# Patient Record
Sex: Male | Born: 1985 | Race: White | Hispanic: No | Marital: Married | Smoking: Never smoker
Health system: Southern US, Community
[De-identification: ages and names within clinical notes are randomized; demographics above are authoritative.]

---

## 2008-03-31 ENCOUNTER — Ambulatory Visit (HOSPITAL_COMMUNITY): Admission: RE | Admit: 2008-03-31 | Discharge: 2008-03-31 | Payer: Self-pay | Admitting: Orthopedic Surgery

## 2008-09-20 DIAGNOSIS — D229 Melanocytic nevi, unspecified: Secondary | ICD-10-CM

## 2008-09-20 HISTORY — DX: Melanocytic nevi, unspecified: D22.9

## 2010-10-12 ENCOUNTER — Encounter: Payer: Self-pay | Admitting: Orthopedic Surgery

## 2019-12-17 ENCOUNTER — Ambulatory Visit: Payer: Self-pay

## 2019-12-17 DIAGNOSIS — Z23 Encounter for immunization: Secondary | ICD-10-CM | POA: Diagnosis not present

## 2020-01-18 DIAGNOSIS — Z23 Encounter for immunization: Secondary | ICD-10-CM | POA: Diagnosis not present

## 2020-03-26 DIAGNOSIS — Z20822 Contact with and (suspected) exposure to covid-19: Secondary | ICD-10-CM | POA: Diagnosis not present

## 2020-11-14 ENCOUNTER — Emergency Department (HOSPITAL_COMMUNITY)
Admission: EM | Admit: 2020-11-14 | Discharge: 2020-11-14 | Disposition: A | Discharge status: Home / Self Care | Payer: BC Managed Care – PPO | Attending: Emergency Medicine | Admitting: Emergency Medicine

## 2020-11-14 ENCOUNTER — Emergency Department (HOSPITAL_COMMUNITY): Payer: BC Managed Care – PPO

## 2020-11-14 ENCOUNTER — Other Ambulatory Visit: Payer: Self-pay

## 2020-11-14 DIAGNOSIS — N50812 Left testicular pain: Secondary | ICD-10-CM | POA: Diagnosis not present

## 2020-11-14 DIAGNOSIS — N433 Hydrocele, unspecified: Secondary | ICD-10-CM | POA: Diagnosis not present

## 2020-11-14 DIAGNOSIS — I861 Scrotal varices: Secondary | ICD-10-CM | POA: Diagnosis not present

## 2020-11-14 LAB — URINALYSIS, ROUTINE W REFLEX MICROSCOPIC
Bilirubin Urine: NEGATIVE
Glucose, UA: NEGATIVE mg/dL
Hgb urine dipstick: NEGATIVE
Ketones, ur: NEGATIVE mg/dL
Leukocytes,Ua: NEGATIVE
Nitrite: NEGATIVE
Protein, ur: NEGATIVE mg/dL
Specific Gravity, Urine: 1.008 (ref 1.005–1.030)
pH: 7 (ref 5.0–8.0)

## 2020-11-14 NOTE — ED Triage Notes (Signed)
Pt here POV with reports of pain to L testicle since Tuesday. Reports pain has not gone all the way away and is a constant dull pain and then gets sharp pains.

## 2020-11-14 NOTE — Discharge Instructions (Addendum)
Your ultrasound and urinalysis today were overall reassuring and do not show an obvious cause for your testicular pain.  Could be mild orchitis due to recent viral infection, you can treat with Tylenol, ibuprofen, and make sure you are wearing supportive underwear, avoid running or strenuous activity. I have sent gonorrhea and Chlamydia testing although I have low suspicion for this if any of these tests do come back positive you will be contacted by phone and will need to follow-up for treatment.  If symptoms persist please follow-up with urology for further evaluation.

## 2020-11-14 NOTE — ED Provider Notes (Signed)
[facility] EMERGENCY DEPARTMENT Provider Note   CSN: [id number] Arrival date & time: 11/14/20  0845     History Chief Complaint  Patient presents with  . Testicle Pain    Marco Parker is a 35 y.o. male.  Marco Parker is a 35 y.o. male who is otherwise healthy, presents to the emergency department for evaluation of left testicular pain.  He reports symptoms started on Tuesday night when he started having severe sharp stabbing pains in the left testicle.  He reports when the started he felt a little bit lightheaded and nauseous but then pain seemed to ease off.  He reports he has continued to have a dull ache that will not resolve and will intermittently have some similar sharp pains.  He has not noted any swelling or masses.  No discoloration of the scrotum.  Denies any burning or pain with urination or penile discharge.  Reports he is sexually active only with his wife and has low suspicion for STI.  No hematuria.  No associated abdominal pain.  Denies any history of prior testicular pain.  Does report that he and all of his family members just got over Covid in the past week or 2.        No past medical history on file.  There are no problems to display for this patient.        No family history on file.     Home Medications Prior to Admission medications   Not on File    Allergies    Patient has no known allergies.  Review of Systems   Review of Systems  Constitutional: Negative for chills and fever.  HENT: Negative.   Respiratory: Negative for cough and shortness of breath.   Cardiovascular: Negative for chest pain.  Gastrointestinal: Negative for abdominal pain, nausea and vomiting.  Genitourinary: Positive for testicular pain. Negative for decreased urine volume, dysuria, flank pain, frequency, genital sores, penile pain, penile swelling and scrotal swelling.  Musculoskeletal: Negative for arthralgias and myalgias.  Skin: Negative for  color change and rash.  Neurological: Negative for dizziness, syncope and light-headedness.  All other systems reviewed and are negative.   Physical Exam Updated Vital Signs BP (!) 166/86   Pulse 85   Temp 98.1 F (36.7 C) (Oral)   Resp 16   Ht 5\' 11"  (1.803 m)   Wt 68 kg   SpO2 100%   BMI 20.92 kg/m   Physical Exam Vitals and nursing note reviewed.  Constitutional:      General: He is not in acute distress.    Appearance: Normal appearance. He is well-developed, normal weight and well-nourished. He is not ill-appearing or diaphoretic.     Comments: Well-appearing and in no distress   HENT:     Head: Normocephalic and atraumatic.     Mouth/Throat:     Mouth: Oropharynx is clear and moist. Mucous membranes are moist.     Pharynx: Oropharynx is clear.  Eyes:     General:        Right eye: No discharge.        Left eye: No discharge.     Extraocular Movements: EOM normal.  Cardiovascular:     Rate and Rhythm: Normal rate and regular rhythm.     Pulses: Intact distal pulses.     Heart sounds: Normal heart sounds. No murmur heard. No friction rub. No gallop.   Pulmonary:     Effort: Pulmonary effort is normal.  No respiratory distress.     Breath sounds: Normal breath sounds. No wheezing or rales.  Abdominal:     General: Bowel sounds are normal. There is no distension.     Palpations: Abdomen is soft. There is no mass.     Tenderness: There is no abdominal tenderness. There is no guarding.     Comments: Abdomen soft, nondistended, nontender to palpation in all quadrants without guarding or peritoneal signs   Genitourinary:    Comments: There is some mild tenderness present over the left testicle, slight swelling noted but no obvious masses, no tenderness over the right testicle, it is higher riding than the left.  Mild erythema generally over the scrotum.  Penis normal without discharge.  No inguinal lymphadenopathy or tenderness over the inguinal canal, no inguinal hernias  noted. Musculoskeletal:        General: No deformity or edema.     Cervical back: Neck supple.  Skin:    General: Skin is warm and dry.     Capillary Refill: Capillary refill takes less than 2 seconds.  Neurological:     Mental Status: He is alert.     Coordination: Coordination normal.     Comments: Speech is clear, able to follow commands Moves extremities without ataxia, coordination intact  Psychiatric:        Mood and Affect: Mood normal.        Behavior: Behavior normal.     ED Results / Procedures / Treatments   Labs (all labs ordered are listed, but only abnormal results are displayed) Labs Reviewed  URINALYSIS, ROUTINE W REFLEX MICROSCOPIC - Abnormal; Notable for the following components:      Result Value   Color, Urine STRAW (*)    All other components within normal limits  GC/CHLAMYDIA PROBE AMP ([place]) NOT AT [facility]    EKG None  Radiology US SCROTUM W/DOPPLER  Result Date: 11/14/2020 CLINICAL DATA:  Left testicular pain since Tuesday EXAM: SCROTAL ULTRASOUND DOPPLER ULTRASOUND OF THE TESTICLES TECHNIQUE: Complete ultrasound examination of the testicles, epididymis, and other scrotal structures was performed. Color and spectral Doppler ultrasound were also utilized to evaluate blood flow to the testicles. COMPARISON:  None. FINDINGS: Right testicle Measurements: 5.0 x 2.7 x 3.4 cm. No mass or microlithiasis visualized. Left testicle Measurements: 4.2 x 3.6 x 3.4 cm. No mass or microlithiasis visualized. Right epididymis:  Normal in size and appearance. Left epididymis:  Normal in size and appearance. Hydrocele:  Small right hydrocele. Varicocele:  Borderline right-sided varicocele. Pulsed Doppler interrogation of both testes demonstrates normal low resistance arterial and venous waveforms bilaterally. Symmetric color Doppler signal bilaterally. IMPRESSION: 1. No acute process or explanation for left-sided testicular pain. No evidence of testicular torsion,  epididymitis. 2. Small right hydrocele. 3. Borderline right-sided varicocele. Electronically Signed   By: Abigail Miyamoto M.D.   On: 11/14/2020 10:20    Procedures Procedures   Medications Ordered in ED Medications - No data to display  ED Course  I have reviewed the triage vital signs and the nursing notes.  Pertinent labs & imaging results that were available during my care of the patient were reviewed by me and considered in my medical decision making (see chart for details).    MDM Rules/Calculators/A&P                         34 year old male presents with 3 days of testicular pain which was initially severe and is now more  mild, dull ache.  No associated urinary symptoms, slight swelling but no palpable masses.  No associated discharge and no concern for STIs per patient.  No associated abdominal pain and no hernias noted on exam.  Will get testicular ultrasound with Doppler and urinalysis, will also send GC/chlamydia, although have low suspicion for this.  Scrotal ultrasound with no acute process or explanation for left-sided testicular pain, no evidence of torsion or epididymitis.  There is a small right hydrocele and borderline right-sided varicocele.  Patient did recently have Covid infection, question whether he may have mild orchitis.  Urinalysis with no hematuria or signs of infection which is reassuring.  Low suspicion for epididymitis or STI.  Given reassuring work-up will have patient treat with NSAIDs, Tylenol and ensure he wears supportive underwear, will have him avoid strenuous activity or running that could worsen pain.  We will have him follow-up with urology if symptoms or not improving.  He expresses understanding and agreement with this plan.  Discharged home in good condition.  Final Clinical Impression(s) / ED Diagnoses Final diagnoses:  Left testicular pain    Rx / DC Orders ED Discharge Orders    None       Janet Berlin 11/14/20 1220     Sherwood Gambler, MD 11/15/20 [phone]

## 2020-11-14 NOTE — ED Notes (Signed)
Patient transported to Ultrasound 

## 2020-11-14 NOTE — ED Notes (Signed)
Pt returns from ultrasound

## 2020-11-17 LAB — GC/CHLAMYDIA PROBE AMP (~~LOC~~) NOT AT ARMC
Chlamydia: NEGATIVE
Comment: NEGATIVE
Comment: NORMAL
Neisseria Gonorrhea: NEGATIVE

## 2021-06-03 ENCOUNTER — Ambulatory Visit: Payer: Self-pay

## 2021-06-03 DIAGNOSIS — R509 Fever, unspecified: Secondary | ICD-10-CM | POA: Diagnosis not present

## 2021-06-03 DIAGNOSIS — R197 Diarrhea, unspecified: Secondary | ICD-10-CM | POA: Diagnosis not present

## 2021-08-03 ENCOUNTER — Emergency Department: Admit: 2021-08-03 | Discharge status: Absent without leave | Payer: Self-pay

## 2021-08-03 ENCOUNTER — Other Ambulatory Visit: Payer: Self-pay

## 2021-08-03 ENCOUNTER — Emergency Department (INDEPENDENT_AMBULATORY_CARE_PROVIDER_SITE_OTHER)
Admission: EM | Admit: 2021-08-03 | Discharge: 2021-08-03 | Disposition: A | Discharge status: Home / Self Care | Payer: BC Managed Care – PPO | Source: Home / Self Care

## 2021-08-03 DIAGNOSIS — R21 Rash and other nonspecific skin eruption: Secondary | ICD-10-CM | POA: Diagnosis not present

## 2021-08-03 DIAGNOSIS — B029 Zoster without complications: Secondary | ICD-10-CM

## 2021-08-03 MED ORDER — METHYLPREDNISOLONE 4 MG PO TBPK: ORAL_TABLET | ORAL | 0 refills | Status: AC

## 2021-08-03 NOTE — Discharge Instructions (Addendum)
Advised patient rash was initially shingles and now has crusted over and he may have postherpetic nerve pain for several weeks and may use OTC Tylenol or OTC Ibuprofen for pain.  Advised patient to take medication as directed with food to completion.  Encourage patient increase daily water intake while taking this medication.

## 2021-08-03 NOTE — ED Provider Notes (Signed)
[facility]    CSN: [id number] Arrival date & time: 08/03/21  1051      History   Chief Complaint Chief Complaint  Patient presents with   Rash    HPI Marco Parker is a 35 y.o. male.   HPI 35 year old male presents with a rash for 10 days.  Patient reports rash is over her left butt cheek and some tenderness in left groin area.  Reports rash is pruritic.  Reports using cortisone cream and Lotrimin as needed with little to no relief.  History reviewed. No pertinent past medical history.  There are no problems to display for this patient.   History reviewed. No pertinent surgical history.     Home Medications    Prior to Admission medications   Medication Sig Start Date End Date Taking? Authorizing Provider  methylPREDNISolone (MEDROL DOSEPAK) 4 MG TBPK tablet Take as directed. 08/03/21  Yes Eliezer Lofts, [place]    Family History History reviewed. No pertinent family history.  Social History Social History   Tobacco Use   Smoking status: Never   Smokeless tobacco: Never  Vaping Use   Vaping Use: Never used  Substance Use Topics   Alcohol use: Not Currently     Allergies   Patient has no known allergies.   Review of Systems Review of Systems  Skin:  Positive for rash.    Physical Exam Triage Vital Signs ED Triage Vitals  Enc Vitals Group     BP 08/03/21 1112 (!) 141/93     Pulse Rate 08/03/21 1112 75     Resp 08/03/21 1112 18     Temp 08/03/21 1112 98.6 F (37 C)     Temp Source 08/03/21 1112 Oral     SpO2 08/03/21 1112 99 %     Weight --      Height --      Head Circumference --      Peak Flow --      Pain Score 08/03/21 1114 0     Pain Loc --      Pain Edu? --      Excl. in [place]? --    No data found.  Updated Vital Signs BP (!) 141/93 (BP Location: Right Arm)   Pulse 75   Temp 98.6 F (37 C) (Oral)   Resp 18   SpO2 99%   Physical Exam Vitals and nursing note reviewed.  Constitutional:      General: He is not  in acute distress.    Appearance: Normal appearance. He is not ill-appearing.  HENT:     Head: Normocephalic and atraumatic.     Mouth/Throat:     Mouth: Mucous membranes are moist.     Pharynx: Oropharynx is clear.  Eyes:     Extraocular Movements: Extraocular movements intact.     Conjunctiva/sclera: Conjunctivae normal.     Pupils: Pupils are equal, round, and reactive to light.  Cardiovascular:     Rate and Rhythm: Normal rate and regular rhythm.     Pulses: Normal pulses.     Heart sounds: Normal heart sounds.  Pulmonary:     Effort: Pulmonary effort is normal.     Breath sounds: Normal breath sounds.  Musculoskeletal:        General: Normal range of motion.     Comments: Left sided gluteal cleft/mid gluteus maximus: Crusted maculopapular rash noted, patient describes rash is pruritic and mildly painful.  Skin:    General: Skin is warm and  dry.  Neurological:     General: No focal deficit present.     Mental Status: He is alert and oriented to person, place, and time.     UC Treatments / Results  Labs (all labs ordered are listed, but only abnormal results are displayed) Labs Reviewed - No data to display  EKG   Radiology No results found.  Procedures Procedures (including critical care time)  Medications Ordered in UC Medications - No data to display  Initial Impression / Assessment and Plan / UC Course  I have reviewed the triage vital signs and the nursing notes.  Pertinent labs & imaging results that were available during my care of the patient were reviewed by me and considered in my medical decision making (see chart for details).     MDM: 1.  Rash and nonspecific rash-Rx'd Medrol Dosepak; 2.  Herpes zoster without complication-Advised patient rash was initially shingles and now has crusted over and he may have postherpetic nerve pain for several weeks and may use OTC Tylenol or OTC Ibuprofen for pain.  Patient discharged home, hemodynamically  stable. Final Clinical Impressions(s) / UC Diagnoses   Final diagnoses:  Rash and nonspecific skin eruption  Herpes zoster without complication     Discharge Instructions      Advised patient rash was initially shingles and now has crusted over and he may have postherpetic nerve pain for several weeks and may use OTC Tylenol or OTC Ibuprofen for pain.  Advised patient to take medication as directed with food to completion.  Encourage patient increase daily water intake while taking this medication.     ED Prescriptions     Medication Sig Dispense Auth. Provider   methylPREDNISolone (MEDROL DOSEPAK) 4 MG TBPK tablet Take as directed. 1 each Eliezer Lofts, FNP      PDMP not reviewed this encounter.   Eliezer Lofts, [place] 08/03/21 1258

## 2021-08-03 NOTE — ED Triage Notes (Signed)
Pt c/o rash x 10 days. Located just on LT buttcheek, also states having some tenderness in LT groin area. Rash is primarily itchy. No hx of shingles. Cortisone cream and lotrimin prn no relief.

## 2021-08-31 ENCOUNTER — Encounter: Payer: Self-pay | Admitting: Dermatology

## 2021-08-31 ENCOUNTER — Ambulatory Visit (INDEPENDENT_AMBULATORY_CARE_PROVIDER_SITE_OTHER): Payer: BC Managed Care – PPO | Admitting: Dermatology

## 2021-08-31 ENCOUNTER — Other Ambulatory Visit: Payer: Self-pay

## 2021-08-31 DIAGNOSIS — D1801 Hemangioma of skin and subcutaneous tissue: Secondary | ICD-10-CM

## 2021-08-31 DIAGNOSIS — Z1283 Encounter for screening for malignant neoplasm of skin: Secondary | ICD-10-CM | POA: Diagnosis not present

## 2021-09-11 DIAGNOSIS — R030 Elevated blood-pressure reading, without diagnosis of hypertension: Secondary | ICD-10-CM | POA: Diagnosis not present

## 2021-09-11 DIAGNOSIS — Z0000 Encounter for general adult medical examination without abnormal findings: Secondary | ICD-10-CM | POA: Diagnosis not present

## 2021-09-11 DIAGNOSIS — Z23 Encounter for immunization: Secondary | ICD-10-CM | POA: Diagnosis not present

## 2021-09-11 DIAGNOSIS — Z13220 Encounter for screening for lipoid disorders: Secondary | ICD-10-CM | POA: Diagnosis not present

## 2021-09-22 ENCOUNTER — Encounter: Payer: Self-pay | Admitting: Dermatology

## 2021-09-22 NOTE — Progress Notes (Signed)
° °  New Patient   Subjective  Marco Parker is a 36 y.o. male who presents for the following: Annual Exam (Patients states he had shingles a month ago. Patient has history of MOD marked mid upper abd. ).  General skin examination Location:  Duration:  Quality:  Associated Signs/Symptoms: Modifying Factors:  Severity:  Timing: Context:    The following portions of the chart were reviewed this encounter and updated as appropriate:  Tobacco   Allergies   Meds   Problems   Med Hx   Surg Hx   Fam Hx       Objective  Well appearing patient in no apparent distress; mood and affect are within normal limits. Half dozen 1 to 2 mm smooth red dermal papules  Ankle up exam today: No new or recurrent atypical pigmented lesions, no sign of nonmelanoma skin cancer.  No sign of active shingles.    A full examination was performed including scalp, head, eyes, ears, nose, lips, neck, chest, axillae, abdomen, back, buttocks, bilateral upper extremities, bilateral lower extremities, hands, feet, fingers, toes, fingernails, and toenails. All findings within normal limits unless otherwise noted below.  Feet not examined.   Assessment & Plan  Cherry angioma  Benign, ok to leave    Screening exam for skin cancer  Yearly skin check, encouraged to self examine twice annually.  Continued ultraviolet protection.

## 2022-05-31 DIAGNOSIS — J029 Acute pharyngitis, unspecified: Secondary | ICD-10-CM | POA: Diagnosis not present

## 2022-05-31 DIAGNOSIS — J329 Chronic sinusitis, unspecified: Secondary | ICD-10-CM | POA: Diagnosis not present

## 2022-08-02 DIAGNOSIS — M546 Pain in thoracic spine: Secondary | ICD-10-CM | POA: Diagnosis not present

## 2022-08-02 DIAGNOSIS — M9903 Segmental and somatic dysfunction of lumbar region: Secondary | ICD-10-CM | POA: Diagnosis not present

## 2022-08-02 DIAGNOSIS — M5137 Other intervertebral disc degeneration, lumbosacral region: Secondary | ICD-10-CM | POA: Diagnosis not present

## 2022-08-02 DIAGNOSIS — M9901 Segmental and somatic dysfunction of cervical region: Secondary | ICD-10-CM | POA: Diagnosis not present

## 2022-08-02 DIAGNOSIS — M9902 Segmental and somatic dysfunction of thoracic region: Secondary | ICD-10-CM | POA: Diagnosis not present

## 2022-08-05 DIAGNOSIS — M25652 Stiffness of left hip, not elsewhere classified: Secondary | ICD-10-CM | POA: Diagnosis not present

## 2022-08-05 DIAGNOSIS — M25651 Stiffness of right hip, not elsewhere classified: Secondary | ICD-10-CM | POA: Diagnosis not present

## 2022-08-05 DIAGNOSIS — M5417 Radiculopathy, lumbosacral region: Secondary | ICD-10-CM | POA: Diagnosis not present

## 2022-08-05 DIAGNOSIS — R293 Abnormal posture: Secondary | ICD-10-CM | POA: Diagnosis not present

## 2022-08-11 DIAGNOSIS — M25652 Stiffness of left hip, not elsewhere classified: Secondary | ICD-10-CM | POA: Diagnosis not present

## 2022-08-11 DIAGNOSIS — M25651 Stiffness of right hip, not elsewhere classified: Secondary | ICD-10-CM | POA: Diagnosis not present

## 2022-08-11 DIAGNOSIS — R293 Abnormal posture: Secondary | ICD-10-CM | POA: Diagnosis not present

## 2022-08-11 DIAGNOSIS — M5417 Radiculopathy, lumbosacral region: Secondary | ICD-10-CM | POA: Diagnosis not present

## 2022-08-16 DIAGNOSIS — M25651 Stiffness of right hip, not elsewhere classified: Secondary | ICD-10-CM | POA: Diagnosis not present

## 2022-08-16 DIAGNOSIS — R293 Abnormal posture: Secondary | ICD-10-CM | POA: Diagnosis not present

## 2022-08-16 DIAGNOSIS — M25652 Stiffness of left hip, not elsewhere classified: Secondary | ICD-10-CM | POA: Diagnosis not present

## 2022-08-16 DIAGNOSIS — M5417 Radiculopathy, lumbosacral region: Secondary | ICD-10-CM | POA: Diagnosis not present

## 2022-08-20 DIAGNOSIS — M5417 Radiculopathy, lumbosacral region: Secondary | ICD-10-CM | POA: Diagnosis not present

## 2022-08-20 DIAGNOSIS — M25651 Stiffness of right hip, not elsewhere classified: Secondary | ICD-10-CM | POA: Diagnosis not present

## 2022-08-20 DIAGNOSIS — R293 Abnormal posture: Secondary | ICD-10-CM | POA: Diagnosis not present

## 2022-08-20 DIAGNOSIS — M25652 Stiffness of left hip, not elsewhere classified: Secondary | ICD-10-CM | POA: Diagnosis not present

## 2022-08-27 DIAGNOSIS — M25651 Stiffness of right hip, not elsewhere classified: Secondary | ICD-10-CM | POA: Diagnosis not present

## 2022-08-27 DIAGNOSIS — M25652 Stiffness of left hip, not elsewhere classified: Secondary | ICD-10-CM | POA: Diagnosis not present

## 2022-08-27 DIAGNOSIS — M5417 Radiculopathy, lumbosacral region: Secondary | ICD-10-CM | POA: Diagnosis not present

## 2022-08-27 DIAGNOSIS — R293 Abnormal posture: Secondary | ICD-10-CM | POA: Diagnosis not present

## 2022-08-31 ENCOUNTER — Ambulatory Visit: Payer: BC Managed Care – PPO | Admitting: Dermatology

## 2022-09-01 DIAGNOSIS — M25651 Stiffness of right hip, not elsewhere classified: Secondary | ICD-10-CM | POA: Diagnosis not present

## 2022-09-01 DIAGNOSIS — R293 Abnormal posture: Secondary | ICD-10-CM | POA: Diagnosis not present

## 2022-09-01 DIAGNOSIS — M25652 Stiffness of left hip, not elsewhere classified: Secondary | ICD-10-CM | POA: Diagnosis not present

## 2022-09-01 DIAGNOSIS — M5417 Radiculopathy, lumbosacral region: Secondary | ICD-10-CM | POA: Diagnosis not present

## 2022-09-03 DIAGNOSIS — M5417 Radiculopathy, lumbosacral region: Secondary | ICD-10-CM | POA: Diagnosis not present

## 2022-09-03 DIAGNOSIS — M25651 Stiffness of right hip, not elsewhere classified: Secondary | ICD-10-CM | POA: Diagnosis not present

## 2022-09-03 DIAGNOSIS — R293 Abnormal posture: Secondary | ICD-10-CM | POA: Diagnosis not present

## 2022-09-03 DIAGNOSIS — M25652 Stiffness of left hip, not elsewhere classified: Secondary | ICD-10-CM | POA: Diagnosis not present

## 2022-09-07 DIAGNOSIS — M5417 Radiculopathy, lumbosacral region: Secondary | ICD-10-CM | POA: Diagnosis not present

## 2022-09-07 DIAGNOSIS — R293 Abnormal posture: Secondary | ICD-10-CM | POA: Diagnosis not present

## 2022-09-07 DIAGNOSIS — M25651 Stiffness of right hip, not elsewhere classified: Secondary | ICD-10-CM | POA: Diagnosis not present

## 2022-09-07 DIAGNOSIS — M25652 Stiffness of left hip, not elsewhere classified: Secondary | ICD-10-CM | POA: Diagnosis not present

## 2022-09-10 DIAGNOSIS — M25651 Stiffness of right hip, not elsewhere classified: Secondary | ICD-10-CM | POA: Diagnosis not present

## 2022-09-10 DIAGNOSIS — M5417 Radiculopathy, lumbosacral region: Secondary | ICD-10-CM | POA: Diagnosis not present

## 2022-09-10 DIAGNOSIS — R293 Abnormal posture: Secondary | ICD-10-CM | POA: Diagnosis not present

## 2022-09-10 DIAGNOSIS — M25652 Stiffness of left hip, not elsewhere classified: Secondary | ICD-10-CM | POA: Diagnosis not present

## 2022-09-23 DIAGNOSIS — R293 Abnormal posture: Secondary | ICD-10-CM | POA: Diagnosis not present

## 2022-09-23 DIAGNOSIS — M25652 Stiffness of left hip, not elsewhere classified: Secondary | ICD-10-CM | POA: Diagnosis not present

## 2022-09-23 DIAGNOSIS — M5417 Radiculopathy, lumbosacral region: Secondary | ICD-10-CM | POA: Diagnosis not present

## 2022-09-23 DIAGNOSIS — M25651 Stiffness of right hip, not elsewhere classified: Secondary | ICD-10-CM | POA: Diagnosis not present

## 2023-02-14 IMAGING — US US SCROTUM W/ DOPPLER COMPLETE
1 series · 14 of 25 positions shown · non-contrast
Comparison: None.

CLINICAL DATA: Left testicular pain since [REDACTED]

EXAM:
SCROTAL ULTRASOUND
DOPPLER ULTRASOUND OF THE TESTICLES
TECHNIQUE: Complete ultrasound examination of the testicles, epididymis, and
other scrotal structures was performed. Color and spectral Doppler
ultrasound were also utilized to evaluate blood flow to the
testicles.

[Series 1: us scrotum w/doppler · 14 of 64 slices shown]
[im 1/64]
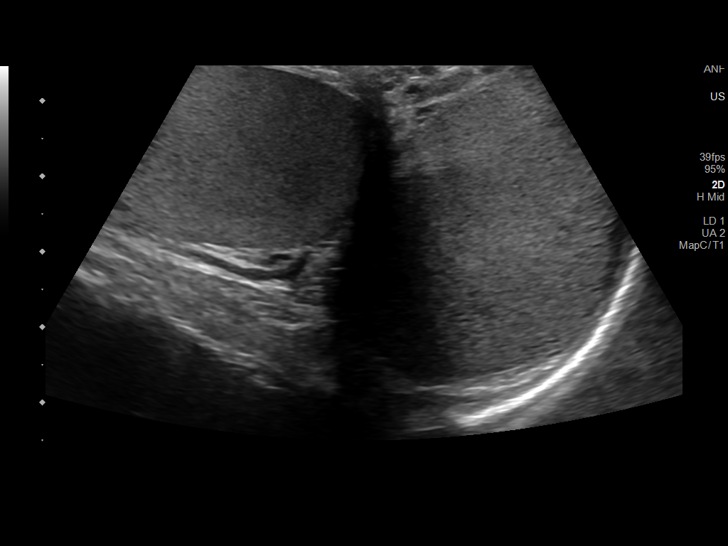
[im 6/64]
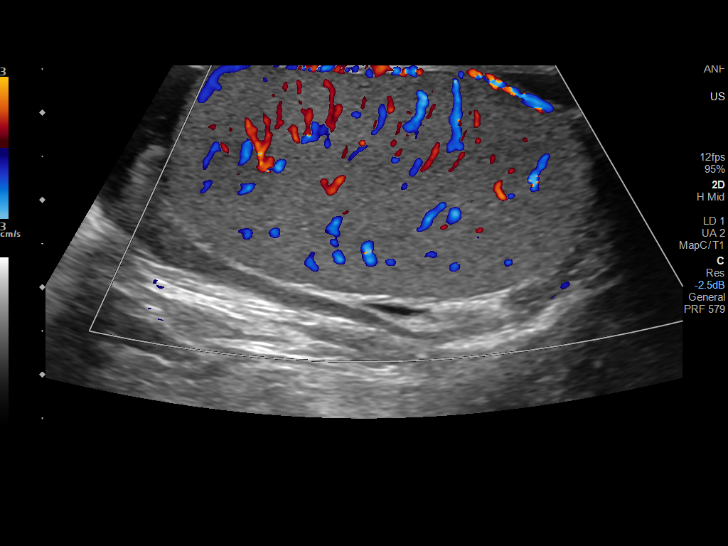
[im 11/64]
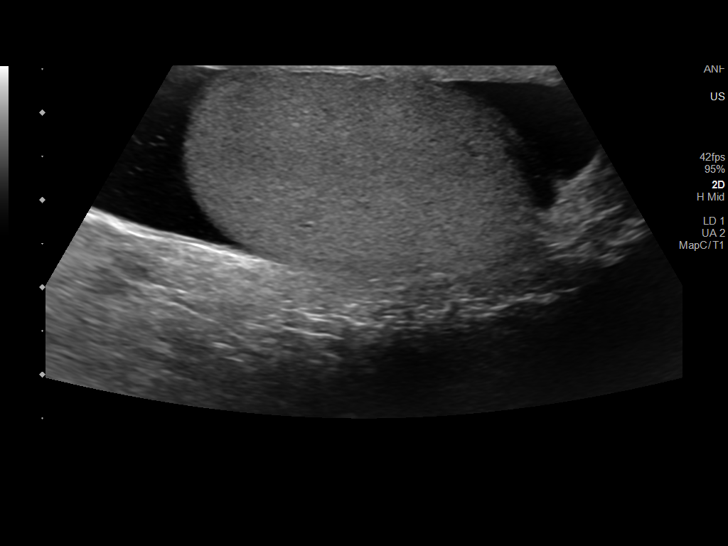
[im 16/64]
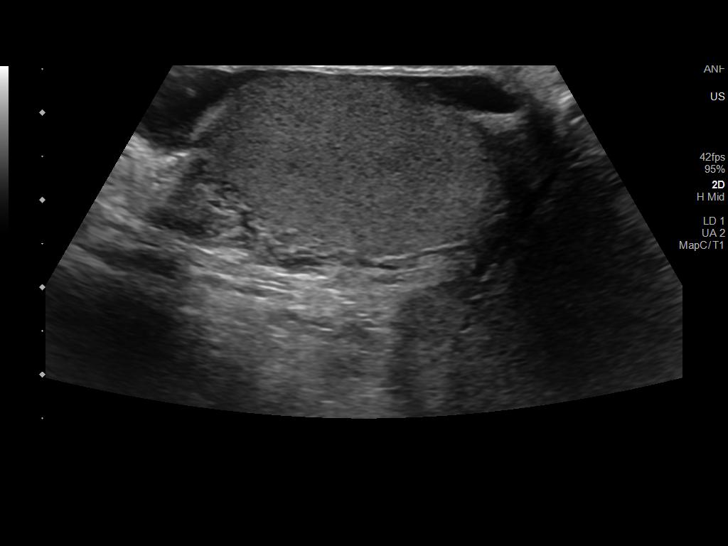
[im 22/64]
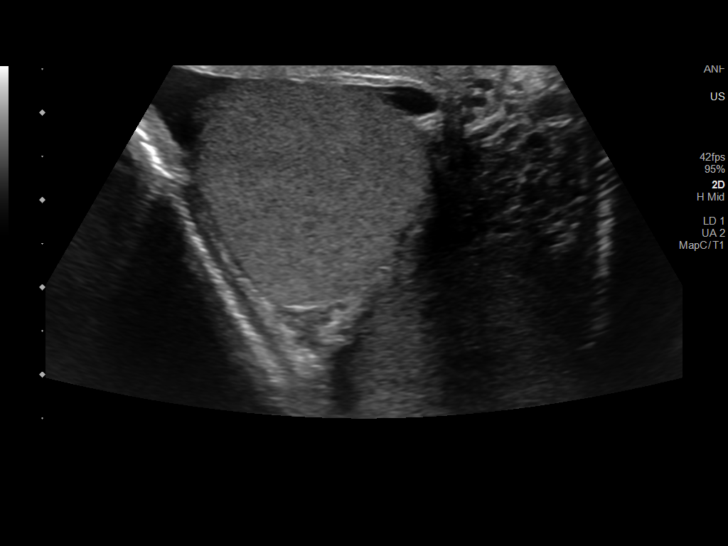
[im 24/64]
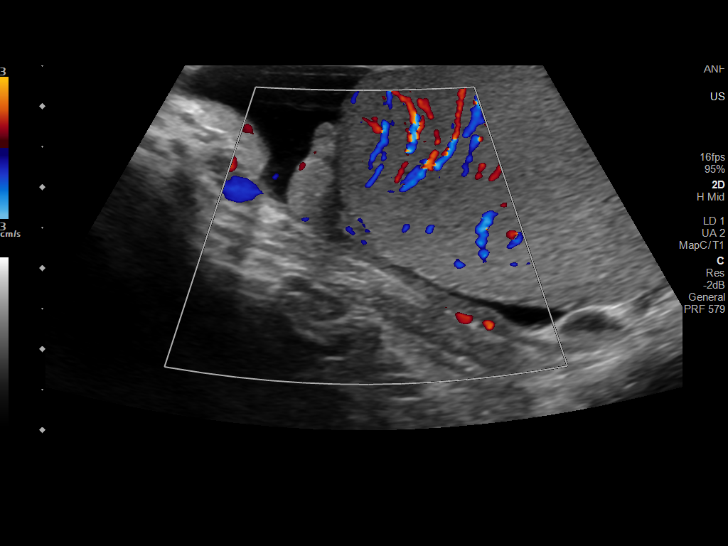
[im 29/64]
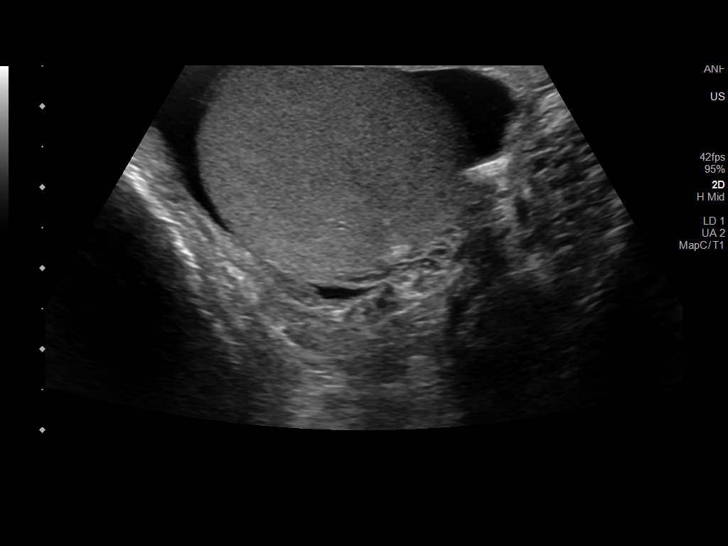
[im 35/64]
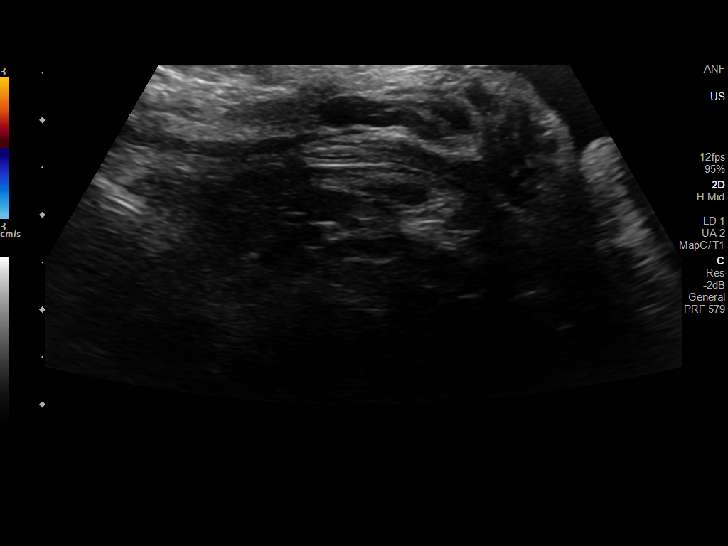
[im 40/64]
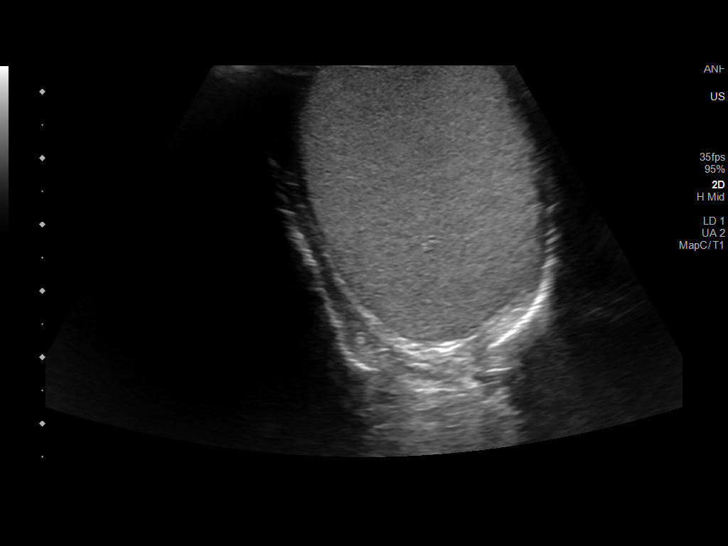
[im 43/64]
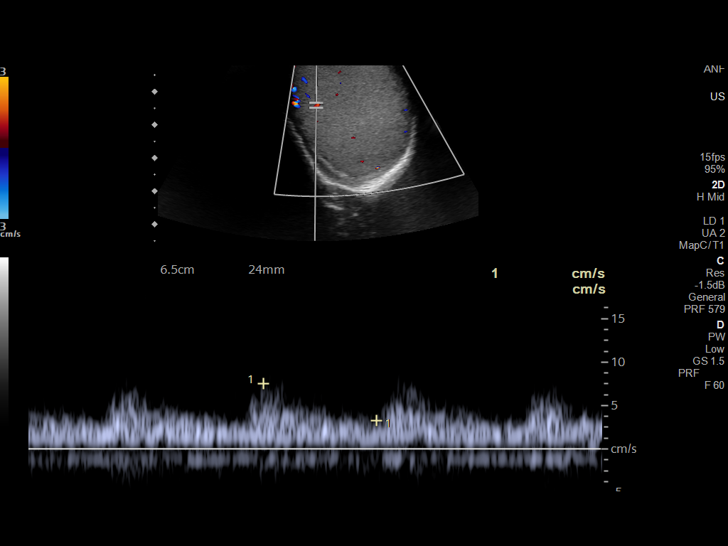
[im 48/64]
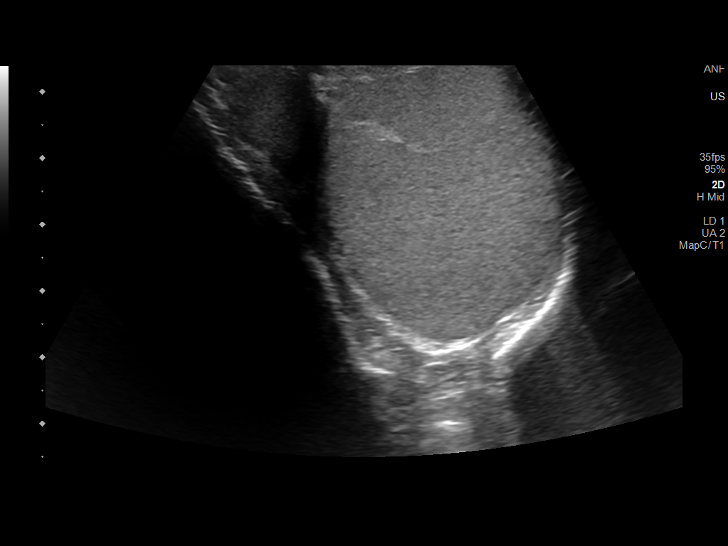
[im 53/64]
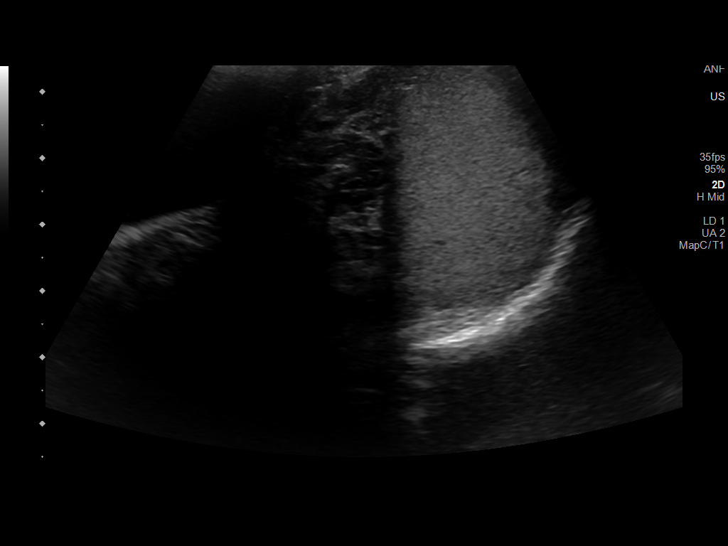
[im 58/64]
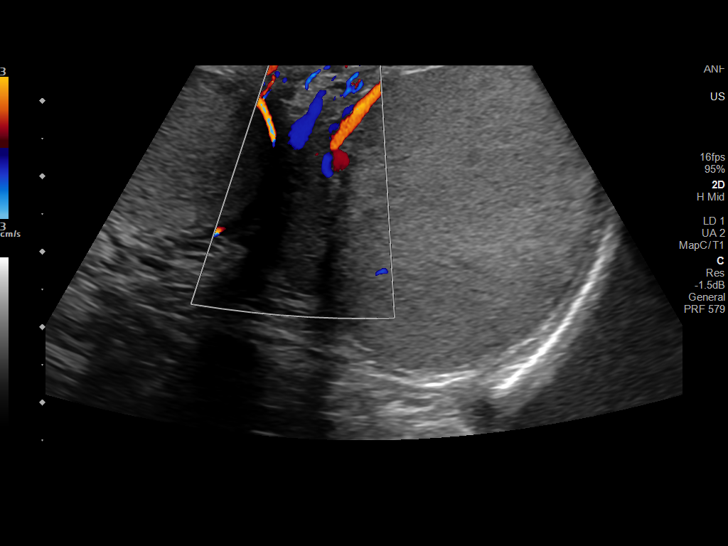
[im 64/64]
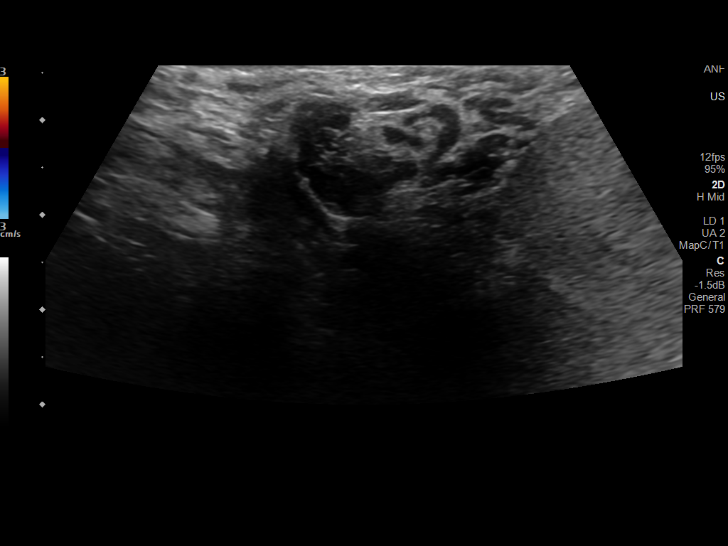

[14 of 25 positions shown; findings below may reference images not displayed]

FINDINGS: Right testicle

Measurements: 5.0 x 2.7 x 3.4 cm. No mass or microlithiasis
visualized.

Left testicle

Measurements: 4.2 x 3.6 x 3.4 cm. No mass or microlithiasis
visualized.

Right epididymis:  Normal in size and appearance.

Left epididymis:  Normal in size and appearance.

Hydrocele:  Small right hydrocele.

Varicocele:  Borderline right-sided varicocele.

Pulsed Doppler interrogation of both testes demonstrates normal low
resistance arterial and venous waveforms bilaterally. Symmetric
color Doppler signal bilaterally.
IMPRESSION: 1. No acute process or explanation for left-sided testicular pain.
No evidence of testicular torsion, epididymitis.
2. Small right hydrocele.
3. Borderline right-sided varicocele.

## 2023-04-10 DIAGNOSIS — J208 Acute bronchitis due to other specified organisms: Secondary | ICD-10-CM | POA: Diagnosis not present

## 2023-05-31 DIAGNOSIS — X32XXXA Exposure to sunlight, initial encounter: Secondary | ICD-10-CM | POA: Diagnosis not present

## 2023-05-31 DIAGNOSIS — Z1283 Encounter for screening for malignant neoplasm of skin: Secondary | ICD-10-CM | POA: Diagnosis not present

## 2023-05-31 DIAGNOSIS — D485 Neoplasm of uncertain behavior of skin: Secondary | ICD-10-CM | POA: Diagnosis not present

## 2023-05-31 DIAGNOSIS — L570 Actinic keratosis: Secondary | ICD-10-CM | POA: Diagnosis not present

## 2023-05-31 DIAGNOSIS — D225 Melanocytic nevi of trunk: Secondary | ICD-10-CM | POA: Diagnosis not present

## 2023-06-15 DIAGNOSIS — L988 Other specified disorders of the skin and subcutaneous tissue: Secondary | ICD-10-CM | POA: Diagnosis not present

## 2023-06-15 DIAGNOSIS — D485 Neoplasm of uncertain behavior of skin: Secondary | ICD-10-CM | POA: Diagnosis not present

## 2023-08-05 DIAGNOSIS — D2272 Melanocytic nevi of left lower limb, including hip: Secondary | ICD-10-CM | POA: Diagnosis not present

## 2023-08-05 DIAGNOSIS — D225 Melanocytic nevi of trunk: Secondary | ICD-10-CM | POA: Diagnosis not present

## 2023-08-05 DIAGNOSIS — D485 Neoplasm of uncertain behavior of skin: Secondary | ICD-10-CM | POA: Diagnosis not present

## 2023-08-22 DIAGNOSIS — E7800 Pure hypercholesterolemia, unspecified: Secondary | ICD-10-CM | POA: Diagnosis not present

## 2023-08-22 DIAGNOSIS — Z23 Encounter for immunization: Secondary | ICD-10-CM | POA: Diagnosis not present

## 2023-08-22 DIAGNOSIS — Z0000 Encounter for general adult medical examination without abnormal findings: Secondary | ICD-10-CM | POA: Diagnosis not present

## 2023-08-23 DIAGNOSIS — D485 Neoplasm of uncertain behavior of skin: Secondary | ICD-10-CM | POA: Diagnosis not present

## 2023-08-23 DIAGNOSIS — L988 Other specified disorders of the skin and subcutaneous tissue: Secondary | ICD-10-CM | POA: Diagnosis not present

## 2024-02-09 DIAGNOSIS — R069 Unspecified abnormalities of breathing: Secondary | ICD-10-CM | POA: Diagnosis not present

## 2024-02-09 DIAGNOSIS — R682 Dry mouth, unspecified: Secondary | ICD-10-CM | POA: Diagnosis not present

## 2024-02-09 DIAGNOSIS — G479 Sleep disorder, unspecified: Secondary | ICD-10-CM | POA: Diagnosis not present

## 2024-02-09 DIAGNOSIS — R519 Headache, unspecified: Secondary | ICD-10-CM | POA: Diagnosis not present

## 2024-03-02 DIAGNOSIS — L905 Scar conditions and fibrosis of skin: Secondary | ICD-10-CM | POA: Diagnosis not present

## 2024-03-02 DIAGNOSIS — D2272 Melanocytic nevi of left lower limb, including hip: Secondary | ICD-10-CM | POA: Diagnosis not present

## 2024-03-02 DIAGNOSIS — Z1283 Encounter for screening for malignant neoplasm of skin: Secondary | ICD-10-CM | POA: Diagnosis not present

## 2024-03-02 DIAGNOSIS — D225 Melanocytic nevi of trunk: Secondary | ICD-10-CM | POA: Diagnosis not present

## 2024-03-13 DIAGNOSIS — R069 Unspecified abnormalities of breathing: Secondary | ICD-10-CM | POA: Diagnosis not present

## 2024-03-13 DIAGNOSIS — R682 Dry mouth, unspecified: Secondary | ICD-10-CM | POA: Diagnosis not present

## 2024-03-13 DIAGNOSIS — R519 Headache, unspecified: Secondary | ICD-10-CM | POA: Diagnosis not present

## 2024-03-13 DIAGNOSIS — G479 Sleep disorder, unspecified: Secondary | ICD-10-CM | POA: Diagnosis not present

## 2024-03-20 DIAGNOSIS — R0683 Snoring: Secondary | ICD-10-CM | POA: Diagnosis not present
